# Patient Record
Sex: Female | Born: 1980 | Hispanic: No
Health system: Southern US, Community
[De-identification: ages and names within clinical notes are randomized; demographics above are authoritative.]

---

## 2019-11-10 ENCOUNTER — Other Ambulatory Visit: Payer: Self-pay

## 2019-11-10 DIAGNOSIS — Z20822 Contact with and (suspected) exposure to covid-19: Secondary | ICD-10-CM

## 2019-11-12 LAB — NOVEL CORONAVIRUS, NAA: SARS-CoV-2, NAA: NOT DETECTED

## 2021-03-06 ENCOUNTER — Encounter: Payer: Self-pay | Admitting: Family Medicine

## 2021-03-06 ENCOUNTER — Ambulatory Visit (INDEPENDENT_AMBULATORY_CARE_PROVIDER_SITE_OTHER)
Admission: RE | Admit: 2021-03-06 | Discharge: 2021-03-06 | Disposition: A | Discharge status: Home / Self Care | Payer: 59 | Source: Ambulatory Visit | Attending: Family Medicine | Admitting: Family Medicine

## 2021-03-06 ENCOUNTER — Ambulatory Visit (INDEPENDENT_AMBULATORY_CARE_PROVIDER_SITE_OTHER): Payer: 59 | Admitting: Family Medicine

## 2021-03-06 ENCOUNTER — Other Ambulatory Visit: Payer: Self-pay

## 2021-03-06 VITALS — BP 120/80 | HR 77 | Ht 64.0 in | Wt 166.0 lb

## 2021-03-06 DIAGNOSIS — M546 Pain in thoracic spine: Secondary | ICD-10-CM

## 2021-03-06 MED ORDER — TIZANIDINE HCL 4 MG PO TABS: 4.0000 mg | ORAL_TABLET | Freq: Three times a day (TID) | ORAL | 1 refills | Status: AC | PRN

## 2021-03-06 NOTE — Progress Notes (Signed)
   Brittany Bridges, am serving as a Neurosurgeon for Dr. Clementeen Bridges.  Subjective:    CC: back pain  HPI: Pt is a 40 y/o female c/o back pain ongoing for Last June stopped working out because the pain and started working about again about 2 months ago and 2 weeks ago pain came back. Pt locates pain Middle of shoulder blades describes pain as sharp in nature. Brittany Bridges blend a lot of holding type exercising.   Radiates: no Numbness/tingling: no Weakness:no Aggravates: working out, Lifting anything heavy; some stretches  Treatments tried: stopped working out  Pertinent review of Systems: No fevers or chills  Relevant historical information: Healthy otherwise   Objective:    Vitals:   03/06/21 1608  BP: 120/80  Pulse: 77  SpO2: 98%   General: Well Developed, well nourished, and in no acute distress.   MSK: T-spine normal-appearing Mildly tender palpation midline T-spine near inferior region of scapula.  And perispinal musculature. Patient does have some scapular winging with internal rotation of her shoulder. Scapular motion is otherwise normal. Upper extremity strength is normal. Thoracic motion is normal.  Lab and Radiology Results  X-ray T-spine was ordered and will be completed following the visit.   Impression and Recommendations:    Assessment and Plan: 40 y.o. female with pain in the thoracic spine following exercise.  This is most likely musculature however her midline pain does concern me a bit.  We will proceed with x-ray evaluation.  However we will proceed to physical therapy as the main treatment methodology.  Will prescribe tizanidine muscle relaxer especially for use at bedtime.  Check back in 6 weeks especially if not improving.  PDMP not reviewed this encounter. Orders Placed This Encounter  Procedures  . DG Thoracic Spine 2 View    Standing Status:   Future    Number of Occurrences:   1    Standing Expiration Date:   03/06/2022    Order Specific Question:    Reason for Exam (SYMPTOM  OR DIAGNOSIS REQUIRED)    Answer:   eval pain tspine    Order Specific Question:   Is patient pregnant?    Answer:   No    Order Specific Question:   Preferred imaging location?    Answer:   [facility]  . Ambulatory referral to Physical Therapy    Referral Priority:   Routine    Referral Type:   Physical Medicine    Referral Reason:   Specialty Services Required    Requested Specialty:   Physical Therapy   Meds ordered this encounter  Medications  . tiZANidine (ZANAFLEX) 4 MG tablet    Sig: Take 1 tablet (4 mg total) by mouth every 8 (eight) hours as needed for muscle spasms.    Dispense:  30 tablet    Refill:  1    Discussed warning signs or symptoms. Please see discharge instructions. Patient expresses understanding.   The above documentation has been reviewed and is accurate and complete Brittany Bridges, M.D.

## 2021-03-06 NOTE — Patient Instructions (Addendum)
Thank you for coming in today.  Please get an Xray today before you leave  I've referred you to Physical Therapy.  Let us know if you don't hear from them in one week.  Use the muscle relaxer as needed mostly at bedtime.   Use heat.   Recheck in 6 weeks.   Let me know if this is not working.   TENS UNIT: This is helpful for muscle pain and spasm.   Search and Purchase a TENS 7000 2nd edition at  www.tenspros.com or www.Amazon.com It should be less than $30.     TENS unit instructions: Do not shower or bathe with the unit on . Turn the unit off before removing electrodes or batteries . If the electrodes lose stickiness add a drop of water to the electrodes after they are disconnected from the unit and place on plastic sheet. If you continued to have difficulty, call the TENS unit company to purchase more electrodes. . Do not apply lotion on the skin area prior to use. Make sure the skin is clean and dry as this will help prolong the life of the electrodes. . After use, always check skin for unusual red areas, rash or other skin difficulties. If there are any skin problems, does not apply electrodes to the same area. . Never remove the electrodes from the unit by pulling the wires. . Do not use the TENS unit or electrodes other than as directed. . Do not change electrode placement without consultating your therapist or physician. Marland Kitchen Keep 2 fingers with between each electrode. . Wear time ratio is 2:1, on to off times.    For example on for 30 minutes off for 15 minutes and then on for 30 minutes off for 15 minutes

## 2021-03-08 NOTE — Progress Notes (Signed)
X-ray thoracic spine looks normal to radiology

## 2021-03-12 ENCOUNTER — Ambulatory Visit (INDEPENDENT_AMBULATORY_CARE_PROVIDER_SITE_OTHER): Payer: 59 | Admitting: Physical Therapy

## 2021-03-12 ENCOUNTER — Encounter: Payer: Self-pay | Admitting: Physical Therapy

## 2021-03-12 ENCOUNTER — Other Ambulatory Visit: Payer: Self-pay

## 2021-03-12 DIAGNOSIS — M546 Pain in thoracic spine: Secondary | ICD-10-CM | POA: Diagnosis not present

## 2021-03-12 DIAGNOSIS — M62830 Muscle spasm of back: Secondary | ICD-10-CM | POA: Diagnosis not present

## 2021-03-13 NOTE — Therapy (Signed)
[facility] [address] [place], [place], [address] Phone: [phone]   Fax:  [phone]  Physical Therapy Evaluation  Patient Details  Name: Brittany Bridges MRN: [id number] Date of Birth: 1981/01/15 Referring Provider (PT): Clementeen Graham   Encounter Date: 03/12/2021   PT End of Session - 03/13/21 0818    Visit Number 1    Number of Visits 12    Date for PT Re-Evaluation 04/23/21    Authorization Type [employer]    PT Start Time 1312    PT Stop Time 1348    PT Time Calculation (min) 36 min    Activity Tolerance Patient tolerated treatment well    Behavior During Therapy [facility] for tasks assessed/performed           History reviewed. No pertinent past medical history.  History reviewed. No pertinent surgical history.  There were no vitals filed for this visit.    Subjective Assessment - 03/12/21 1314    Subjective Pt reports pain over thoracic spine in between her shoulder blades that has persisted for 1 year. Pt stopped exercising in August of 2021 to reduce pain and resumed exercise about 1 month ago. Pain returned 2 weeks ago after resuming exercise.For exercise, pt enjoys participating in pure barre and beach body video work outs and going on walks. Pt was lifting 2 lb weights while exercising but had to stop due to pain. Pts pain also increases when she wears a constrictive sports bra. 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    Limitations Lifting;Walking;House hold activities    Patient Stated Goals decreased pain.    Currently in Pain? Yes    Pain Score 7     Pain Location Thoracic    Pain Orientation Mid    Pain Descriptors / Indicators Aching;Sharp    Pain Type Acute pain    Pain Onset More than a month ago    Pain Frequency Intermittent    Aggravating Factors  Lifting, exercise, reaching, sports bra    Pain Relieving Factors icy hot              [facility] PT  Assessment - 03/13/21 0001      Assessment   Medical Diagnosis Thoracic pain    Referring Provider (PT) Clementeen Graham    Hand Dominance Right    Prior Therapy no      Precautions   Precautions None      Balance Screen   Has the patient fallen in the past 6 months No      Prior Function   Level of Independence Independent      Cognition   Overall Cognitive Status Within Functional Limits for tasks assessed      AROM   Overall AROM  Within functional limits for tasks performed    Overall AROM Comments Shoulder and spine ROM: WNL      Strength   Overall Strength Comments Shoulders: 4+/5, Scapular: 4-/5      Palpation   Palpation comment Tenderness and pain in central thoracic and lumbar spine, with PAs., Tightness/soreness in bil Rhomboids. Hypomobile segments in lumbar spine.      Special Tests   Other special tests No radicular pain, numbness/tingling.                      Objective measurements completed on examination: See above findings.  [facility] Adult PT Treatment/Exercise - 03/13/21 0001      Exercises   Exercises Lumbar      Lumbar Exercises: Quadruped   Madcat/Old Horse 10 reps    Other Quadruped Lumbar Exercises childs pose with sidebending 2 x 30 sec      Shoulder Exercises: Supine   ABduction --    ABduction Limitations --      Shoulder Exercises: Seated   Retraction Both;10 reps      Shoulder Exercises: Prone   Other Prone Exercises Prone T, and I , x10 bil;      Manual Therapy   Manual Therapy Joint mobilization;Soft tissue mobilization    Joint Mobilization PA glide of thoracic and lumbar vertebrae; pain    Soft tissue mobilization STM of pts rhomboids and thoracic paraspinals                  PT Education - 03/13/21 0817    Education Details PT POC, initial HEP, examination findings    Person(s) Educated Patient    Methods Explanation;Demonstration;Tactile cues;Verbal cues;Handout    Comprehension Verbalized  understanding;Returned demonstration;Verbal cues required;Tactile cues required            PT Short Term Goals - 03/13/21 0828      PT SHORT TERM GOAL #1   Title Pt will be able to perform initial HEP independently    Time 2    Period Weeks    Status New    Target Date 03/26/21             PT Long Term Goals - 03/13/21 0828      PT LONG TERM GOAL #1   Title Pt will be able to perform final HEP independently    Time 6    Period Weeks    Status New    Target Date 04/23/21      PT LONG TERM GOAL #2   Title Pt will report pain no greater than a 2/10 after participating in beach body exercise    Time 6    Period Weeks    Status New    Target Date 04/23/21      PT LONG TERM GOAL #3   Title Pt will be able to lift at least  25 lb weight with proper body mechanics for increased pt safety when performing lfting activities at work.    Time 6    Period Weeks    Target Date 04/23/21      PT LONG TERM GOAL #4   Title Pt to demo increased strength of scapular muscles to at least 4+/5 to improve ability for reaching and lifting activities.    Time 6    Period Weeks    Status New    Target Date 04/23/21                  Plan - 03/13/21 0819    Clinical Impression Statement Pt is a 40 year old female with thoracic pain along midline of the thoracic region. Pt has experenced pain for 1 year and symptoms are reproduced with exercises and bilateral rotation. She has hypomobility in thoracic and lumbar spine with PAs, but hypermobility in other joints/extremities. Pt is an Print production planner and frequently lifts boxes up tp 50 lbs. Pt will benefit from education on posture, safe lifting mechnics and core strengthening. Prognosis is good due to pts age and activity level. Plan to utilize therapeutic exercises for core and scapular stabilizer strengthing to reduce  pain and increase pt safety with lifting.    Personal Factors and Comorbidities Time since onset of  injury/illness/exacerbation;Profession    Examination-Activity Limitations Carry;Lift;Reach Overhead    Examination-Participation Restrictions Occupation;Community Activity    Stability/Clinical Decision Making Stable/Uncomplicated    Clinical Decision Making Low    Rehab Potential Good    PT Frequency 2x / week    PT Duration 6 weeks    PT Treatment/Interventions ADLs/Self Care Home Management;Cryotherapy;Electrical Stimulation;Iontophoresis 4mg /ml Dexamethasone;Moist Heat;Traction;Ultrasound;Functional mobility training;Therapeutic activities;Therapeutic exercise;Manual techniques;Passive range of motion;Dry needling;Taping;Spinal Manipulations;Joint Manipulations;Neuromuscular re-education    Consulted and Agree with Plan of Care Patient           Patient will benefit from skilled therapeutic intervention in order to improve the following deficits and impairments:  Increased muscle spasms,Pain,Hypomobility,Improper body mechanics,Decreased activity tolerance,Decreased strength  Visit Diagnosis: Pain in thoracic spine  Muscle spasm of back     Problem List There are no problems to display for this patient.   SPT 03/13/2021  [facility] [address] [place], [place], [place] Phone: [phone]   Fax:  [phone]  Name: Brittany Bridges MRN: Joaquin Courts Date of Birth: Aug 09, 1981   This entire session was performed under direct supervision and direction of a licensed therapist/therapist assistant . I have personally read, edited and approve of the note as written.  11/11/1981, PT, DPT 9:06 AM  03/13/21

## 2021-03-19 ENCOUNTER — Ambulatory Visit (INDEPENDENT_AMBULATORY_CARE_PROVIDER_SITE_OTHER): Payer: 59 | Admitting: Physical Therapy

## 2021-03-19 ENCOUNTER — Other Ambulatory Visit: Payer: Self-pay

## 2021-03-19 ENCOUNTER — Encounter: Payer: Self-pay | Admitting: Physical Therapy

## 2021-03-19 DIAGNOSIS — M546 Pain in thoracic spine: Secondary | ICD-10-CM | POA: Diagnosis not present

## 2021-03-19 DIAGNOSIS — M62830 Muscle spasm of back: Secondary | ICD-10-CM

## 2021-03-19 NOTE — Therapy (Signed)
[facility] [address] [place], [place], [address] Phone: [phone]   Fax:  [phone]  Physical Therapy Treatment  Patient Details  Name: Brittany Bridges MRN: [id number] Date of Birth: 1981/10/27 Referring Provider (PT): Clementeen Graham   Encounter Date: 03/19/2021   PT End of Session - 03/19/21 1508    Visit Number 2    Number of Visits 12    Date for PT Re-Evaluation 04/23/21    Authorization Type [employer]    PT Start Time 1351    PT Stop Time 1429    PT Time Calculation (min) 38 min    Activity Tolerance Patient tolerated treatment well    Behavior During Therapy [facility] for tasks assessed/performed           History reviewed. No pertinent past medical history.  History reviewed. No pertinent surgical history.  There were no vitals filed for this visit.   Subjective Assessment - 03/19/21 1353    Subjective Pt reports no pain in back over the weekend but that she felt sore at work when using her UEs. Pt reports increased pain on R scapular region vs L.    Limitations Lifting;Walking;House hold activities    Patient Stated Goals decreased pain.    Currently in Pain? Yes    Pain Score 4     Pain Location Thoracic    Pain Orientation Mid    Pain Descriptors / Indicators Aching;Sharp    Pain Type Acute pain    Pain Onset More than a month ago    Pain Frequency Intermittent    Aggravating Factors  lifting                             [facility] Adult PT Treatment/Exercise - 03/19/21 0001      Lumbar Exercises: Quadruped   Madcat/Old Horse 15 reps    Other Quadruped Lumbar Exercises childs pose with sidebending 2 x 30 sec      Shoulder Exercises: Supine   Horizontal ABduction Both;10 reps    Theraband Level (Shoulder Horizontal ABduction) Level 2 (Red)    Horizontal ABduction Limitations cues for TA contraction    Other Supine Exercises Glute bridges x 10 with cues for TA contraction       Shoulder Exercises: Prone   Other Prone Exercises Prone T, and I , x10 bil; pain      Shoulder Exercises: Standing   External Rotation Both;10 reps    Theraband Level (Shoulder External Rotation) Level 1 (Yellow)    Row Both;10 reps    Theraband Level (Shoulder Row) Level 2 (Red)      Manual Therapy   Manual Therapy Joint mobilization;Soft tissue mobilization    Manual therapy comments skilled palpation and monitoring of soft tissue with dry needling.    Joint Mobilization PA glide of thoracic vertebrae    Soft tissue mobilization STM of pts rhomboids and thoracic paraspinals            Trigger Point Dry Needling - 03/19/21 0001    Consent Given? Yes    Education Handout Provided Yes    Muscles Treated Back/Hip Thoracic multifidi    Thoracic multifidi response Palpable increased muscle length   T4-8 region on R;  3 needles                 PT Short Term Goals - 03/13/21 0828      PT SHORT TERM GOAL #  1   Title Pt will be able to perform initial HEP independently    Time 2    Period Weeks    Status New    Target Date 03/26/21             PT Long Term Goals - 03/13/21 0828      PT LONG TERM GOAL #1   Title Pt will be able to perform final HEP independently    Time 6    Period Weeks    Status New    Target Date 04/23/21      PT LONG TERM GOAL #2   Title Pt will report pain no greater than a 2/10 after participating in beach body exercise    Time 6    Period Weeks    Status New    Target Date 04/23/21      PT LONG TERM GOAL #3   Title Pt will be able to lift at least  25 lb weight with proper body mechanics for increased pt safety when performing lfting activities at work.    Time 6    Period Weeks    Target Date 04/23/21      PT LONG TERM GOAL #4   Title Pt to demo increased strength of scapular muscles to at least 4+/5 to improve ability for reaching and lifting activities.    Time 6    Period Weeks    Status New    Target Date 04/23/21                  Plan - 03/19/21 1508    Clinical Impression Statement Pt was able to perform scapular strengthening exercises without increased pain. Pt does not experience pain at rest but continues to have pain after performing functional activities at work. Manual therapy and dry needling were performed to reduce tension in R rhomboids and thoracic paraspinals. Plan to practice safe lifting mechanics for reduced pain at work.    Personal Factors and Comorbidities Time since onset of injury/illness/exacerbation;Profession    Examination-Activity Limitations Carry;Lift;Reach Overhead    Examination-Participation Restrictions Occupation;Community Activity    Stability/Clinical Decision Making Stable/Uncomplicated    Rehab Potential Good    PT Frequency 2x / week    PT Duration 6 weeks    PT Treatment/Interventions ADLs/Self Care Home Management;Cryotherapy;Electrical Stimulation;Iontophoresis 4mg /ml Dexamethasone;Moist Heat;Traction;Ultrasound;Functional mobility training;Therapeutic activities;Therapeutic exercise;Manual techniques;Passive range of motion;Dry needling;Taping;Spinal Manipulations;Joint Manipulations;Neuromuscular re-education    PT Next Visit Plan Practice lifting 5 lb weight with good body mechanics. Ask pt about work station for possible education on ergonomics.    Consulted and Agree with Plan of Care Patient           Patient will benefit from skilled therapeutic intervention in order to improve the following deficits and impairments:  Increased muscle spasms,Pain,Hypomobility,Improper body mechanics,Decreased activity tolerance,Decreased strength  Visit Diagnosis: Pain in thoracic spine  Muscle spasm of back     Problem List There are no problems to display for this patient.   SPT 03/19/2021  This entire session was performed under direct supervision and direction of a licensed therapist/therapist assistant . I have personally read, edited and  approve of the note as written.  03/21/2021, PT, DPT 4:23 PM  03/19/21    [facility] [address] [place], [place], [place] Phone: [phone]   Fax:  [phone]  Name: Brittany Bridges MRN: Joaquin Courts Date of Birth: 1981/07/07

## 2021-03-19 NOTE — Patient Instructions (Signed)
Access Code: [email or web address] URL: [email or web address] Date: 03/19/2021 Prepared by: Sedalia Muta  Exercises Standing Row with Anchored Resistance - 1 x daily - 2 sets - 10 reps Standing Shoulder External Rotation with Resistance - 1 x daily - 1-2 sets - 10 reps Supine Bridge - 1 x daily - 2 sets - 10 reps Supine Shoulder Horizontal Abduction with Resistance - 1 x daily - 1-2 sets - 10 reps

## 2021-03-21 ENCOUNTER — Ambulatory Visit (INDEPENDENT_AMBULATORY_CARE_PROVIDER_SITE_OTHER): Payer: 59 | Admitting: Physical Therapy

## 2021-03-21 ENCOUNTER — Other Ambulatory Visit: Payer: Self-pay

## 2021-03-21 ENCOUNTER — Encounter: Payer: Self-pay | Admitting: Physical Therapy

## 2021-03-21 DIAGNOSIS — M546 Pain in thoracic spine: Secondary | ICD-10-CM | POA: Diagnosis not present

## 2021-03-21 DIAGNOSIS — M62830 Muscle spasm of back: Secondary | ICD-10-CM | POA: Diagnosis not present

## 2021-03-21 NOTE — Therapy (Signed)
[facility] [address] [place], [place], [address] Phone: [phone]   Fax:  [phone]  Physical Therapy Treatment  Patient Details  Name: Brittany Bridges MRN: [id number] Date of Birth: 25-Nov-1981 Referring Provider (PT): Clementeen Graham   Encounter Date: 03/21/2021   PT End of Session - 03/21/21 1503    Visit Number 3    Number of Visits 12    Date for PT Re-Evaluation 04/23/21    Authorization Type [employer]    PT Start Time 1354    PT Stop Time 1433    PT Time Calculation (min) 39 min    Activity Tolerance Patient tolerated treatment well    Behavior During Therapy [facility] for tasks assessed/performed           History reviewed. No pertinent past medical history.  History reviewed. No pertinent surgical history.  There were no vitals filed for this visit.   Subjective Assessment - 03/21/21 1356    Subjective Pt reports her pain has improved and she only experiences discomfort after sitting at her desk for long periods of time.    Limitations Lifting;Walking;House hold activities    Patient Stated Goals decreased pain.    Currently in Pain? Yes    Pain Score 3     Pain Location Thoracic    Pain Orientation Mid    Pain Descriptors / Indicators Aching    Pain Type Acute pain    Pain Onset More than a month ago    Pain Frequency Intermittent    Aggravating Factors  sitting at desk                             [facility] Adult PT Treatment/Exercise - 03/21/21 0001      Lumbar Exercises: Quadruped   Other Quadruped Lumbar Exercises childs pose with sidebending 2 x 30 sec      Shoulder Exercises: Supine   Horizontal ABduction Both;10 reps    Theraband Level (Shoulder Horizontal ABduction) Level 2 (Red)    Horizontal ABduction Limitations cues for TA contraction    Other Supine Exercises Glute bridges x 10 with cues for TA contraction      Shoulder Exercises: Prone   Other Prone Exercises Prone T,  and I , x10 bil; pain      Shoulder Exercises: Standing   External Rotation Both;10 reps    Theraband Level (Shoulder External Rotation) Level 1 (Yellow)    Internal Rotation Both;15 reps    Theraband Level (Shoulder Internal Rotation) Level 2 (Red)    Row Both;10 reps    Theraband Level (Shoulder Row) Level 2 (Red)    Other Standing Exercises Squat to lift 18 lb box x 10    Other Standing Exercises wall push ups x 15      Manual Therapy   Manual Therapy Joint mobilization;Soft tissue mobilization    Joint Mobilization PA glide of thoracic vertebrae; pain    Soft tissue mobilization STM of pts rhomboids and thoracic paraspinals                  PT Education - 03/21/21 1502    Education Details Pt educated on safe lifting mechanics at work. Pt instructed to bring work items closer to her work stations to reduce repetitive bending and reaching motions.    Person(s) Educated Patient    Methods Explanation;Demonstration;Tactile cues;Verbal cues    Comprehension Verbalized understanding;Returned demonstration;Verbal cues required;Tactile cues  required            PT Short Term Goals - 03/21/21 1510      PT SHORT TERM GOAL #1   Title Pt will be able to perform initial HEP independently    Time 2    Period Weeks    Status Achieved    Target Date 03/26/21             PT Long Term Goals - 03/13/21 0828      PT LONG TERM GOAL #1   Title Pt will be able to perform final HEP independently    Time 6    Period Weeks    Status New    Target Date 04/23/21      PT LONG TERM GOAL #2   Title Pt will report pain no greater than a 2/10 after participating in beach body exercise    Time 6    Period Weeks    Status New    Target Date 04/23/21      PT LONG TERM GOAL #3   Title Pt will be able to lift at least  25 lb weight with proper body mechanics for increased pt safety when performing lfting activities at work.    Time 6    Period Weeks    Target Date 04/23/21       PT LONG TERM GOAL #4   Title Pt to demo increased strength of scapular muscles to at least 4+/5 to improve ability for reaching and lifting activities.    Time 6    Period Weeks    Status New    Target Date 04/23/21                 Plan - 03/21/21 1504    Clinical Impression Statement Pt was able to progress core strengthening exercises without increased pain. Pt continues to have pain at work and was educated on safe lifting mechanics to reduce pain and increase pt safety at work. Pts thoracic vertebrae are stiff and tender to palpation. PA glides and STM for rhomboids and thoracic paraspinals were performed to reduce muscle tension and stiffness. Plan to progress core strengthening exercises and continue manual therapy for reduced muscle tension.    Personal Factors and Comorbidities Time since onset of injury/illness/exacerbation;Profession    Examination-Activity Limitations Carry;Lift;Reach Overhead    Examination-Participation Restrictions Occupation;Community Activity    Stability/Clinical Decision Making Stable/Uncomplicated    Rehab Potential Good    PT Frequency 2x / week    PT Duration 6 weeks    PT Treatment/Interventions ADLs/Self Care Home Management;Cryotherapy;Electrical Stimulation;Iontophoresis 4mg /ml Dexamethasone;Moist Heat;Traction;Ultrasound;Functional mobility training;Therapeutic activities;Therapeutic exercise;Manual techniques;Passive range of motion;Dry needling;Taping;Spinal Manipulations;Joint Manipulations;Neuromuscular re-education    PT Next Visit Plan Perform plank exercise    Consulted and Agree with Plan of Care Patient           Patient will benefit from skilled therapeutic intervention in order to improve the following deficits and impairments:  Increased muscle spasms,Pain,Hypomobility,Improper body mechanics,Decreased activity tolerance,Decreased strength  Visit Diagnosis: Pain in thoracic spine  Muscle spasm of back     Problem  List There are no problems to display for this patient.  SPT 03/21/2021  03/23/2021, PT, DPT 4:58 PM  03/21/21   This entire session was performed under direct supervision and direction of a licensed therapist/therapist assistant . I have personally read, edited and approve of the note as written.    [facility] [place] [phone]  [address] [place] [employer] [place], [place], [address] Phone: [phone]   Fax:  [phone]  Name: Brittany Bridges MRN: [id number] Date of Birth: 1981-07-02

## 2021-03-29 ENCOUNTER — Ambulatory Visit (INDEPENDENT_AMBULATORY_CARE_PROVIDER_SITE_OTHER): Payer: 59 | Admitting: Physical Therapy

## 2021-03-29 ENCOUNTER — Encounter: Payer: Self-pay | Admitting: Physical Therapy

## 2021-03-29 DIAGNOSIS — M546 Pain in thoracic spine: Secondary | ICD-10-CM | POA: Diagnosis not present

## 2021-03-29 DIAGNOSIS — M62830 Muscle spasm of back: Secondary | ICD-10-CM

## 2021-03-29 NOTE — Therapy (Addendum)
[place], [place], [address] Phone: [phone]   Fax:  [phone]  Physical Therapy Treatment  Patient Details  Name: Markasia Carrol MRN: [id number] Date of Birth: [birth year] Referring Provider (PT): Lynne Leader   Encounter Date: 03/29/2021   PT End of Session - 03/29/21 1422     Visit Number 4    Number of Visits 12    Date for PT Re-Evaluation 04/23/21    Authorization Type [employer]    PT Start Time 1216    PT Stop Time 1250    PT Time Calculation (min) 34 min    Activity Tolerance Patient tolerated treatment well    Behavior During Therapy [facility] for tasks assessed/performed             History reviewed. No pertinent past medical history.  History reviewed. No pertinent surgical history.  There were no vitals filed for this visit.   Subjective Assessment - 03/29/21 1216     Subjective Pt reports she is feeling much better. Pt reports min pain after walking for 20 minutes or performing repetitive tasks at work.    Limitations Lifting;Walking;House hold activities    Patient Stated Goals decreased pain.    Currently in Pain? No/denies    Pain Location Thoracic    Pain Orientation Mid    Pain Descriptors / Indicators Aching    Pain Type Acute pain    Pain Onset More than a month ago    Pain Frequency Intermittent    Aggravating Factors  bending tasks, walking                               [facility] Adult PT Treatment/Exercise - 03/29/21 0001       Lumbar Exercises: Aerobic   UBE (Upper Arm Bike) 2 min both directions      Shoulder Exercises: Supine   Other Supine Exercises Glute bridges x 10 with cues for TA contraction with GTB      Shoulder Exercises: Prone   Other Prone Exercises Prone T, and I , x10 bil      Shoulder Exercises: Standing   Horizontal ABduction 10 reps    Theraband Level (Shoulder Horizontal ABduction) Level 2 (Red)    External Rotation  Both;15 reps    Theraband Level (Shoulder External Rotation) Level 1 (Yellow)    Internal Rotation Both;15 reps    Theraband Level (Shoulder Internal Rotation) Level 2 (Red)    Row Both;15 reps    Theraband Level (Shoulder Row) Level 3 (Green)    Other Standing Exercises Squat to lift 20 lb box x 10; squat to lift 20 lb box and move it to mat table x 10; scaption x 20 with 3 lb dumbbell    Other Standing Exercises overhead press x 10 with 3 lb weight      Shoulder Exercises: ROM/Strengthening   Wall Pushups 20 reps    Plank 60 seconds      Manual Therapy   Manual Therapy Joint mobilization    Joint Mobilization PA glide of thoracic vertebrae; pain                      PT Short Term Goals - 03/21/21 1510       PT SHORT TERM GOAL #1   Title Pt will be able to perform initial HEP independently    Time 2  Period Weeks    Status Achieved    Target Date 03/26/21               PT Long Term Goals - 03/13/21 0828       PT LONG TERM GOAL #1   Title Pt will be able to perform final HEP independently    Time 6    Period Weeks    Status New    Target Date 04/23/21      PT LONG TERM GOAL #2   Title Pt will report pain no greater than a 2/10 after participating in beach body exercise    Time 6    Period Weeks    Status New    Target Date 04/23/21      PT LONG TERM GOAL #3   Title Pt will be able to lift at least  25 lb weight with proper body mechanics for increased pt safety when performing lfting activities at work.    Time 6    Period Weeks    Target Date 04/23/21      PT LONG TERM GOAL #4   Title Pt to demo increased strength of scapular muscles to at least 4+/5 to improve ability for reaching and lifting activities.    Time 6    Period Weeks    Status New    Target Date 04/23/21                   Plan - 03/29/21 1423     Clinical Impression Statement Pt was able to progress shoulder and core exercises without increased pain and min  cueing for correct form and TA contraction. Pt had min stiffness with CPA glides over thoracic vertebrae and min tension in R thoracic paraspinals and rhomboids. Pt was able to demonstrate improved lifting mechanics with heavier weight and min cueing to straighten spine. PT discussed discharge with pt after 1-2 more visits if pain continues to be low and pt is able to perform HEP independently with correct body mechanics.    Personal Factors and Comorbidities Time since onset of injury/illness/exacerbation;Profession    Examination-Activity Limitations Carry;Lift;Reach Overhead    Examination-Participation Restrictions Occupation;Community Activity    Stability/Clinical Decision Making Stable/Uncomplicated    Clinical Decision Making Low    Rehab Potential Good    PT Frequency 2x / week    PT Duration 6 weeks    PT Treatment/Interventions ADLs/Self Care Home Management;Cryotherapy;Electrical Stimulation;Iontophoresis 93m/ml Dexamethasone;Moist Heat;Traction;Ultrasound;Functional mobility training;Therapeutic activities;Therapeutic exercise;Manual techniques;Passive range of motion;Dry needling;Taping;Spinal Manipulations;Joint Manipulations;Neuromuscular re-education    PT Next Visit Plan Practice optimal body mechanics with final HEP    Consulted and Agree with Plan of Care Patient             Patient will benefit from skilled therapeutic intervention in order to improve the following deficits and impairments:  Increased muscle spasms,Pain,Hypomobility,Improper body mechanics,Decreased activity tolerance,Decreased strength  Visit Diagnosis: Pain in thoracic spine  Muscle spasm of back     Problem List There are no problems to display for this patient.  DLeighton ParodySPT 03/29/2021  This entire session was performed under direct supervision and direction of a licensed therapist/therapist assistant . I have personally read, edited and approve of the note as written.  LLyndee Hensen  PT, DPT 3:15 PM  03/29/21    [place] [address]Phone: [phone]  Fax:  [phone] Name: ALatash NouriMRN: [id number] of  Birth: Nov 19, 1981  PHYSICAL THERAPY DISCHARGE SUMMARY  Visits from Start of Care: 4  Plan: Patient agrees to discharge.  Patient goals were mostly met. Patient is being discharged due to meeting the stated rehab goals, pt did not return for final visit     Lyndee Hensen, PT, DPT 12:32 PM  11/28/21

## 2021-04-17 ENCOUNTER — Ambulatory Visit: Payer: 59 | Admitting: Family Medicine

## 2021-09-23 IMAGING — DX DG THORACIC SPINE 2V
3 series · 3 of 3 positions shown · non-contrast
Comparison: None.

CLINICAL DATA: Thoracic back pain

EXAM:
THORACIC SPINE 2 VIEWS

[t-spine ap]
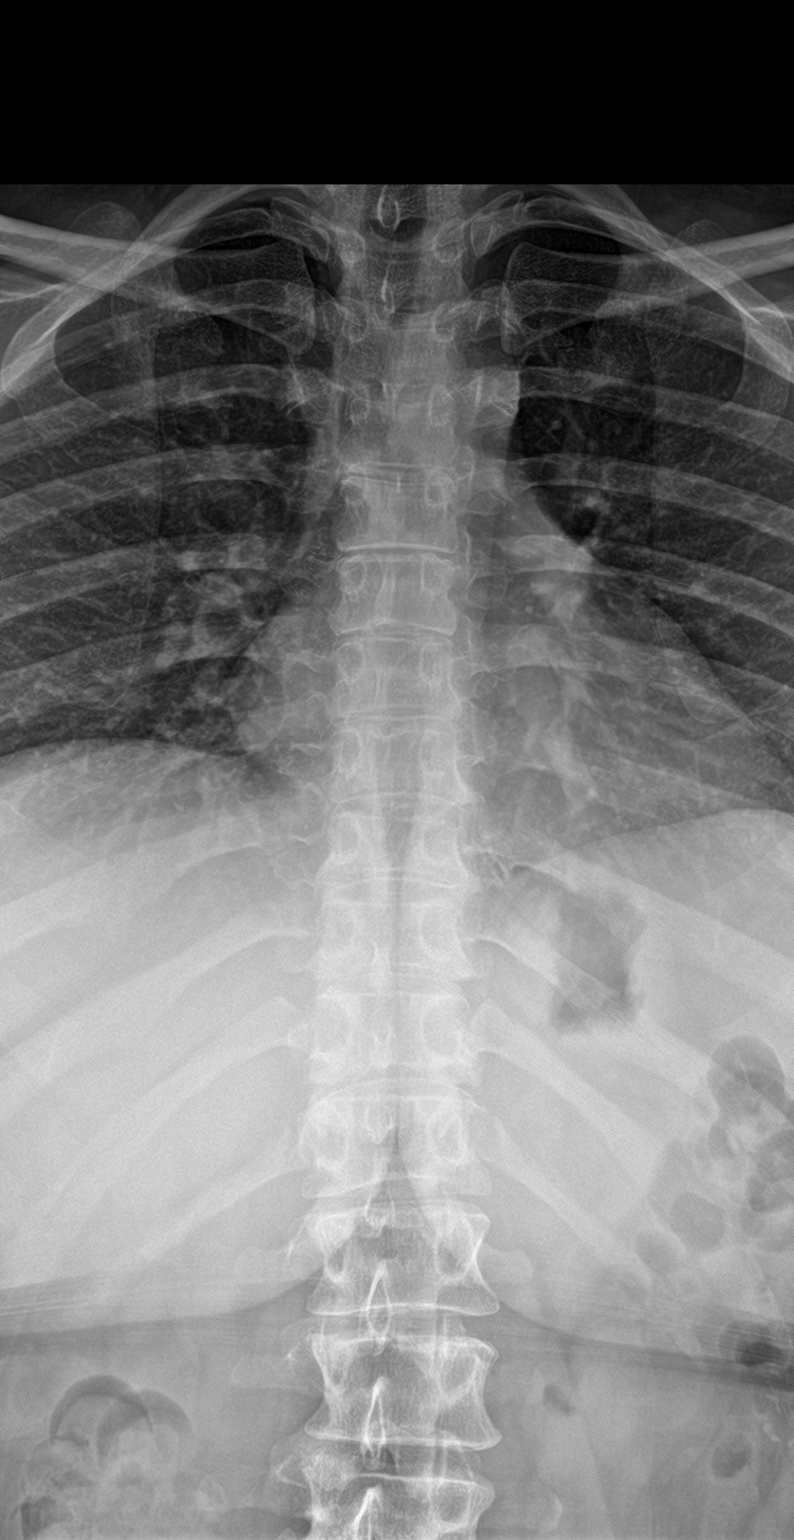

[t-spine lat]
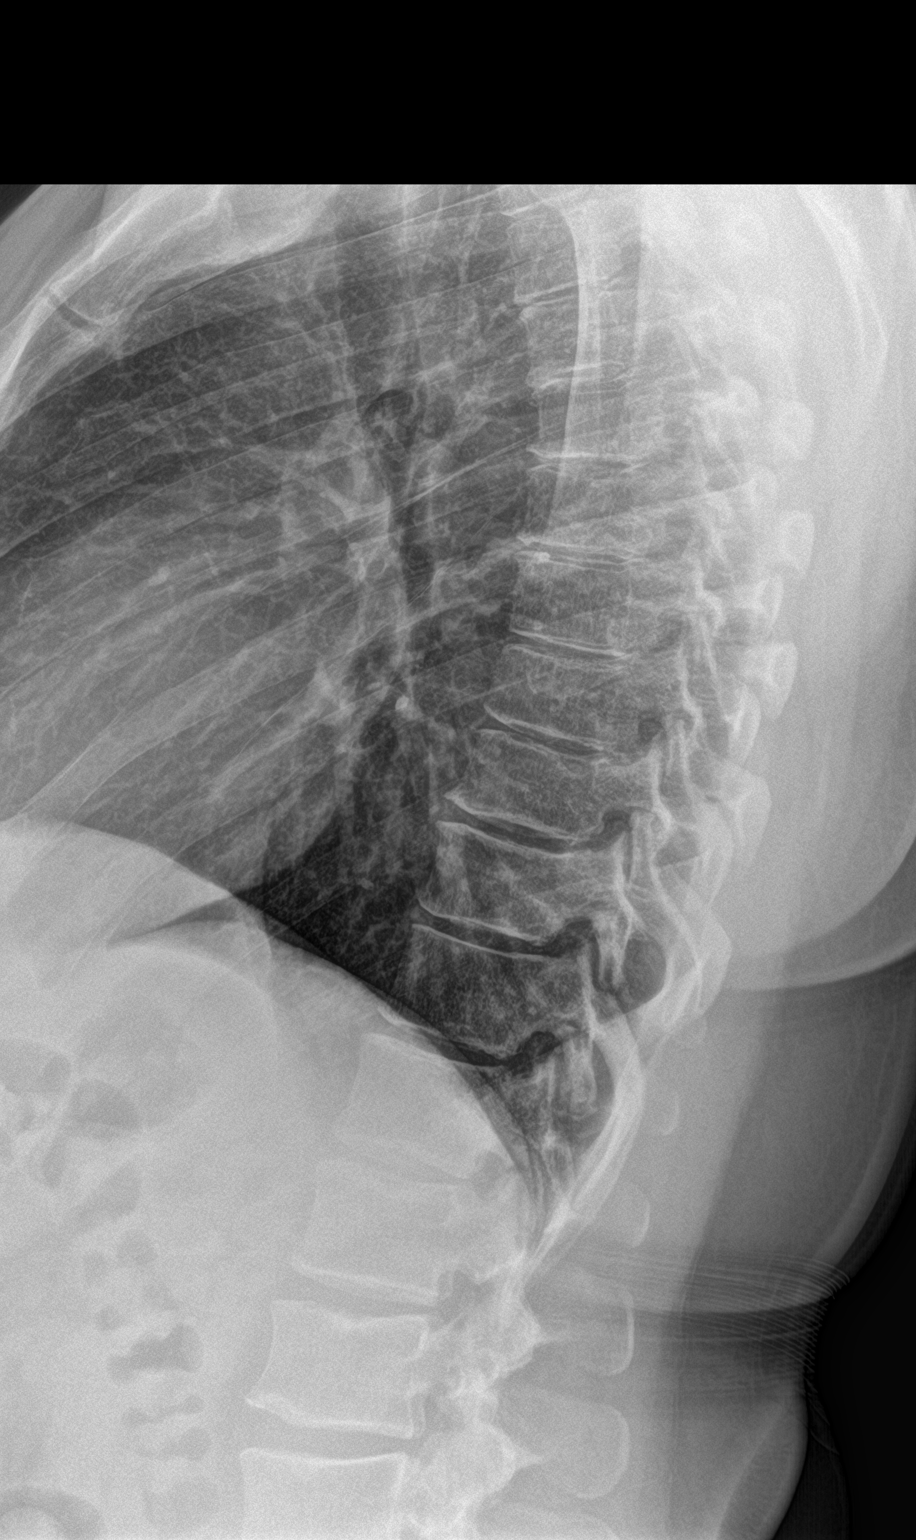

[swimmer]
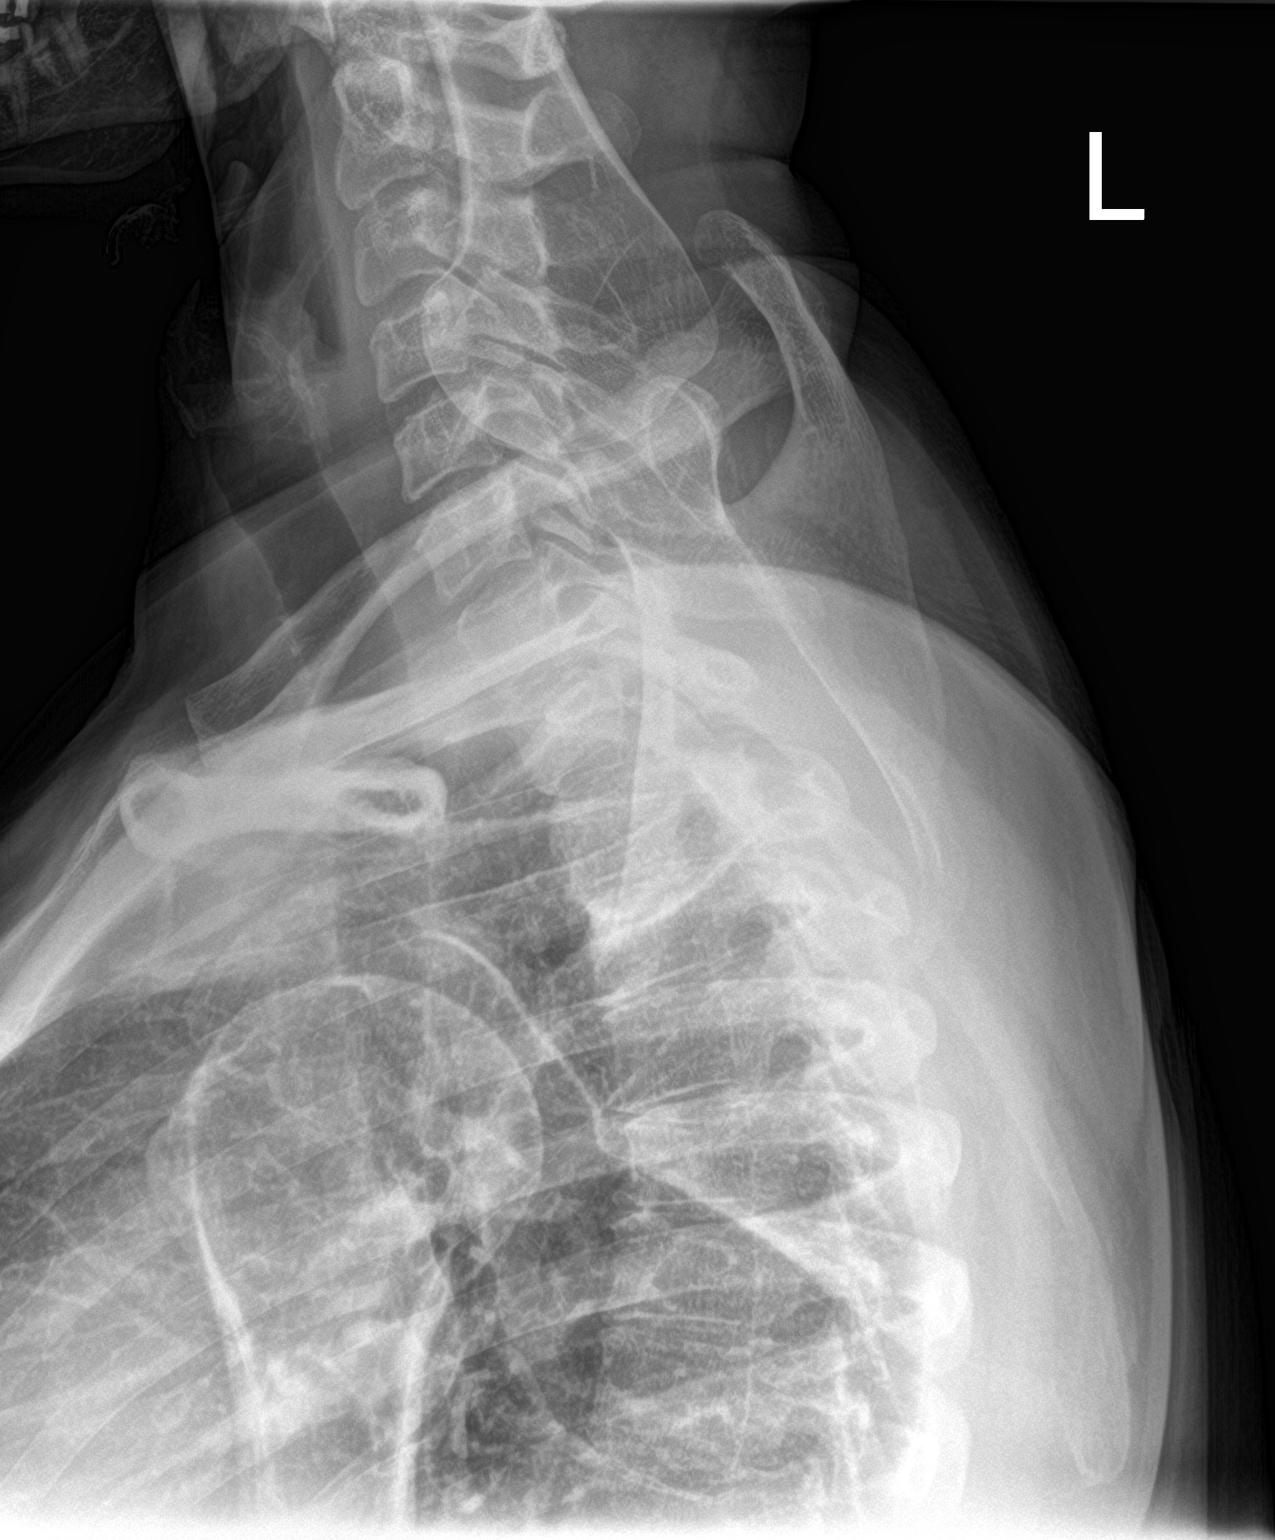

[3 of 3 positions shown; findings below may reference images not displayed]

FINDINGS: There is no evidence of thoracic spine fracture. Alignment is
normal. Intervertebral disc heights are maintained. No other
significant bone abnormalities are identified.
IMPRESSION: Negative.

## 2021-10-21 ENCOUNTER — Ambulatory Visit (HOSPITAL_BASED_OUTPATIENT_CLINIC_OR_DEPARTMENT_OTHER): Payer: 59 | Admitting: Obstetrics & Gynecology
